# Patient Record
Sex: Male | Born: 1970
Health system: Southern US, Community
[De-identification: ages and names within clinical notes are randomized; demographics above are authoritative.]

## PROBLEM LIST (undated history)

## (undated) DIAGNOSIS — G43909 Migraine, unspecified, not intractable, without status migrainosus: Secondary | ICD-10-CM

## (undated) DIAGNOSIS — R55 Syncope and collapse: Secondary | ICD-10-CM

## (undated) DIAGNOSIS — F419 Anxiety disorder, unspecified: Secondary | ICD-10-CM

## (undated) DIAGNOSIS — E785 Hyperlipidemia, unspecified: Secondary | ICD-10-CM

## (undated) DIAGNOSIS — K219 Gastro-esophageal reflux disease without esophagitis: Secondary | ICD-10-CM

## (undated) HISTORY — DX: Syncope and collapse: R55

## (undated) HISTORY — DX: Gastro-esophageal reflux disease without esophagitis: K21.9

## (undated) HISTORY — DX: Anxiety disorder, unspecified: F41.9

## (undated) HISTORY — DX: Hyperlipidemia, unspecified: E78.5

## (undated) HISTORY — DX: Migraine, unspecified, not intractable, without status migrainosus: G43.909

---

## 2013-01-26 LAB — TSH: TSH: 0.59 u[IU]/mL (ref <=5.90)

## 2013-08-09 LAB — HEPATIC FUNCTION PANEL
ALK PHOS: 66 U/L (ref 25–125)
ALT: 32 U/L (ref 10–40)
AST: 21 U/L (ref 14–40)
BILIRUBIN, TOTAL: 0.6 mg/dL

## 2013-08-09 LAB — BASIC METABOLIC PANEL
BUN: 18 mg/dL (ref 4–21)
Creatinine: 0.9 mg/dL (ref 0.6–1.3)
GLUCOSE: 84 mg/dL
Potassium: 4.7 mmol/L (ref 3.4–5.3)
Sodium: 142 mmol/L (ref 137–147)

## 2013-08-09 LAB — LIPID PANEL
Cholesterol: 141 mg/dL (ref 0–200)
HDL: 63 mg/dL (ref 35–70)
LDL CALC: 63 mg/dL
Triglycerides: 74 mg/dL (ref 40–160)

## 2013-09-15 ENCOUNTER — Ambulatory Visit (INDEPENDENT_AMBULATORY_CARE_PROVIDER_SITE_OTHER): Payer: Commercial Managed Care - PPO | Admitting: Internal Medicine

## 2013-09-15 ENCOUNTER — Encounter: Payer: Self-pay | Admitting: Internal Medicine

## 2013-09-15 ENCOUNTER — Other Ambulatory Visit (INDEPENDENT_AMBULATORY_CARE_PROVIDER_SITE_OTHER): Payer: Commercial Managed Care - PPO

## 2013-09-15 VITALS — BP 114/70 | HR 81 | Temp 98.7°F | Resp 16 | Ht 71.0 in | Wt 158.0 lb

## 2013-09-15 DIAGNOSIS — A048 Other specified bacterial intestinal infections: Secondary | ICD-10-CM

## 2013-09-15 DIAGNOSIS — F411 Generalized anxiety disorder: Secondary | ICD-10-CM

## 2013-09-15 DIAGNOSIS — K2100 Gastro-esophageal reflux disease with esophagitis, without bleeding: Secondary | ICD-10-CM

## 2013-09-15 DIAGNOSIS — K219 Gastro-esophageal reflux disease without esophagitis: Secondary | ICD-10-CM | POA: Insufficient documentation

## 2013-09-15 DIAGNOSIS — E785 Hyperlipidemia, unspecified: Secondary | ICD-10-CM

## 2013-09-15 LAB — CBC WITH DIFFERENTIAL/PLATELET
Basophils Absolute: 0 10*3/uL (ref 0.0–0.1)
Basophils Relative: 0.3 % (ref 0.0–3.0)
Eosinophils Absolute: 0.2 10*3/uL (ref 0.0–0.7)
Eosinophils Relative: 4.5 % (ref 0.0–5.0)
HCT: 44.6 % (ref 39.0–52.0)
Hemoglobin: 15.1 g/dL (ref 13.0–17.0)
LYMPHS PCT: 29.4 % (ref 12.0–46.0)
Lymphs Abs: 1.6 10*3/uL (ref 0.7–4.0)
MCHC: 33.8 g/dL (ref 30.0–36.0)
MCV: 94.6 fl (ref 78.0–100.0)
Monocytes Absolute: 0.5 10*3/uL (ref 0.1–1.0)
Monocytes Relative: 9.1 % (ref 3.0–12.0)
NEUTROS PCT: 56.7 % (ref 43.0–77.0)
Neutro Abs: 3.1 10*3/uL (ref 1.4–7.7)
PLATELETS: 190 10*3/uL (ref 150.0–400.0)
RBC: 4.72 Mil/uL (ref 4.22–5.81)
RDW: 13.1 % (ref 11.5–15.5)
WBC: 5.5 10*3/uL (ref 4.0–10.5)

## 2013-09-15 LAB — H. PYLORI ANTIBODY, IGG: H Pylori IgG: POSITIVE — AB

## 2013-09-15 MED ORDER — ALPRAZOLAM 1 MG PO TABS: 1.0000 mg | ORAL_TABLET | Freq: Two times a day (BID) | ORAL | Status: DC | PRN

## 2013-09-15 MED ORDER — LANSOPRAZOLE 30 MG PO CPDR: 30.0000 mg | DELAYED_RELEASE_CAPSULE | Freq: Every day | ORAL | Status: DC

## 2013-09-15 NOTE — Patient Instructions (Signed)
Gastroesophageal Reflux Disease, Adult Gastroesophageal reflux disease (GERD) happens when acid from your stomach flows up into the esophagus. When acid comes in contact with the esophagus, the acid causes soreness (inflammation) in the esophagus. Over time, GERD may create small holes (ulcers) in the lining of the esophagus. CAUSES   Increased body weight. This puts pressure on the stomach, making acid rise from the stomach into the esophagus.  Smoking. This increases acid production in the stomach.  Drinking alcohol. This causes decreased pressure in the lower esophageal sphincter (valve or ring of muscle between the esophagus and stomach), allowing acid from the stomach into the esophagus.  Late evening meals and a full stomach. This increases pressure and acid production in the stomach.  A malformed lower esophageal sphincter. Sometimes, no cause is found. SYMPTOMS   Burning pain in the lower part of the mid-chest behind the breastbone and in the mid-stomach area. This may occur twice a week or more often.  Trouble swallowing.  Sore throat.  Dry cough.  Asthma-like symptoms including chest tightness, shortness of breath, or wheezing. DIAGNOSIS  Your caregiver may be able to diagnose GERD based on your symptoms. In some cases, X-rays and other tests may be done to check for complications or to check the condition of your stomach and esophagus. TREATMENT  Your caregiver may recommend over-the-counter or prescription medicines to help decrease acid production. Ask your caregiver before starting or adding any new medicines.  HOME CARE INSTRUCTIONS   Change the factors that you can control. Ask your caregiver for guidance concerning weight loss, quitting smoking, and alcohol consumption.  Avoid foods and drinks that make your symptoms worse, such as:  Caffeine or alcoholic drinks.  Chocolate.  Peppermint or mint flavorings.  Garlic and onions.  Spicy foods.  Citrus fruits,  such as oranges, lemons, or limes.  Tomato-based foods such as sauce, chili, salsa, and pizza.  Fried and fatty foods.  Avoid lying down for the 3 hours prior to your bedtime or prior to taking a nap.  Eat small, frequent meals instead of large meals.  Wear loose-fitting clothing. Do not wear anything tight around your waist that causes pressure on your stomach.  Raise the head of your bed 6 to 8 inches with wood blocks to help you sleep. Extra pillows will not help.  Only take over-the-counter or prescription medicines for pain, discomfort, or fever as directed by your caregiver.  Do not take aspirin, ibuprofen, or other nonsteroidal anti-inflammatory drugs (NSAIDs). SEEK IMMEDIATE MEDICAL CARE IF:   You have pain in your arms, neck, jaw, teeth, or back.  Your pain increases or changes in intensity or duration.  You develop nausea, vomiting, or sweating (diaphoresis).  You develop shortness of breath, or you faint.  Your vomit is green, yellow, black, or looks like coffee grounds or blood.  Your stool is red, bloody, or black. These symptoms could be signs of other problems, such as heart disease, gastric bleeding, or esophageal bleeding. MAKE SURE YOU:   Understand these instructions.  Will watch your condition.  Will get help right away if you are not doing well or get worse. Document Released: 12/03/2004 Document Revised: 05/18/2011 Document Reviewed: 09/12/2010 ExitCare Patient Information 2015 [employer]. This information is not intended to replace advice given to you by your health care provider. Make sure you discuss any questions you have with your health care provider.  

## 2013-09-15 NOTE — Progress Notes (Signed)
Pre visit review using our clinic review tool, if applicable. No additional management support is needed unless otherwise documented below in the visit note. 

## 2013-09-16 ENCOUNTER — Encounter: Payer: Self-pay | Admitting: Internal Medicine

## 2013-09-16 NOTE — Assessment & Plan Note (Signed)
He is doing well on lipitor 

## 2013-09-16 NOTE — Assessment & Plan Note (Signed)
Ab test is + and he has some concerning UGI symptoms I have asked him to see GI to consider having an EGD done

## 2013-09-16 NOTE — Assessment & Plan Note (Signed)
He does not wants to take an SSRI Will cont xanax as needed

## 2013-09-16 NOTE — Addendum Note (Signed)
Addended by: Janith Lima on: 09/16/2013 12:27 PM   Modules accepted: Orders

## 2013-09-16 NOTE — Progress Notes (Signed)
Subjective:    Patient ID: Mark Flores, male    DOB: 07-10-1970, 43 y.o.   MRN: [id number]  Gastrophageal Reflux He complains of belching, dysphagia and heartburn. He reports no abdominal pain, no chest pain, no choking, no coughing, no early satiety, no globus sensation, no hoarse voice, no nausea, no sore throat, no stridor, no tooth decay, no water brash or no wheezing. This is a recurrent problem. The current episode started more than 1 year ago. The problem occurs frequently. The problem has been gradually worsening. The symptoms are aggravated by ETOH. Pertinent negatives include no anemia, fatigue, melena, muscle weakness, orthopnea or weight loss. Risk factors include ETOH use and NSAIDs. He has tried a histamine-2 antagonist for the symptoms. The treatment provided mild relief. Past procedures include an EGD (several years ago, +HBP test in [place]).      Review of Systems  Constitutional: Negative.  Negative for fever, chills, weight loss, diaphoresis, appetite change, fatigue and unexpected weight change.  HENT: Negative.  Negative for hoarse voice and sore throat.   Eyes: Negative.   Respiratory: Negative.  Negative for cough, choking, chest tightness, shortness of breath, wheezing and stridor.   Cardiovascular: Negative.  Negative for chest pain, palpitations and leg swelling.  Gastrointestinal: Positive for heartburn and dysphagia. Negative for nausea, vomiting, abdominal pain, diarrhea, constipation, blood in stool and melena.  Endocrine: Negative.   Genitourinary: Negative.   Musculoskeletal: Negative.  Negative for arthralgias, back pain, gait problem, joint swelling, muscle weakness, myalgias, neck pain and neck stiffness.  Skin: Negative.  Negative for rash.  Allergic/Immunologic: Negative.   Neurological: Negative.   Hematological: Negative.  Negative for adenopathy. Does not bruise/bleed easily.  Psychiatric/Behavioral: Positive for sleep disturbance. Negative for  suicidal ideas, hallucinations, behavioral problems, confusion, self-injury, dysphoric mood, decreased concentration and agitation. The patient is nervous/anxious. The patient is not hyperactive.        Objective:   Physical Exam  Vitals reviewed. Constitutional: He is oriented to person, place, and time. He appears well-developed and well-nourished. No distress.  HENT:  Head: Normocephalic and atraumatic.  Mouth/Throat: Oropharynx is clear and moist. No oropharyngeal exudate.  Eyes: Conjunctivae are normal. Right eye exhibits no discharge. Left eye exhibits no discharge. No scleral icterus.  Neck: Normal range of motion. Neck supple. No JVD present. No tracheal deviation present. No thyromegaly present.  Cardiovascular: Normal rate, regular rhythm, normal heart sounds and intact distal pulses.  Exam reveals no gallop and no friction rub.   No murmur heard. Pulmonary/Chest: Effort normal and breath sounds normal. No stridor. No respiratory distress. He has no wheezes. He has no rales. He exhibits no tenderness.  Abdominal: Soft. Bowel sounds are normal. He exhibits no distension and no mass. There is no tenderness. There is no rebound and no guarding.  Musculoskeletal: Normal range of motion. He exhibits no edema and no tenderness.  Lymphadenopathy:    He has no cervical adenopathy.  Neurological: He is oriented to person, place, and time.  Skin: Skin is warm and dry. No rash noted. He is not diaphoretic. No erythema. No pallor.  Psychiatric: His speech is normal and behavior is normal. Judgment and thought content normal. His mood appears anxious. His affect is not angry, not blunt, not labile and not inappropriate. Cognition and memory are normal. He does not exhibit a depressed mood. He expresses no homicidal and no suicidal ideation. He expresses no suicidal plans and no homicidal plans.  Assessment & Plan:

## 2013-09-16 NOTE — Assessment & Plan Note (Signed)
I have asked him to start a PPI for this He is not anemic but he does have some concerning UGI s/s so I have asked him to see GI for further evaluation

## 2013-09-18 ENCOUNTER — Other Ambulatory Visit: Payer: Self-pay

## 2013-09-18 MED ORDER — ATORVASTATIN CALCIUM 10 MG PO TABS: 10.0000 mg | ORAL_TABLET | Freq: Every day | ORAL | Status: DC

## 2013-10-03 ENCOUNTER — Encounter: Payer: Self-pay | Admitting: Internal Medicine

## 2013-10-11 ENCOUNTER — Other Ambulatory Visit: Payer: Self-pay

## 2013-10-11 MED ORDER — ATORVASTATIN CALCIUM 10 MG PO TABS: 10.0000 mg | ORAL_TABLET | Freq: Every day | ORAL | Status: DC

## 2013-10-13 ENCOUNTER — Other Ambulatory Visit: Payer: Self-pay | Admitting: Internal Medicine

## 2013-10-13 DIAGNOSIS — E785 Hyperlipidemia, unspecified: Secondary | ICD-10-CM

## 2013-10-13 MED ORDER — ATORVASTATIN CALCIUM 20 MG PO TABS: 20.0000 mg | ORAL_TABLET | Freq: Every day | ORAL | Status: DC

## 2013-12-11 ENCOUNTER — Encounter: Payer: Self-pay | Admitting: Internal Medicine

## 2013-12-11 ENCOUNTER — Ambulatory Visit (INDEPENDENT_AMBULATORY_CARE_PROVIDER_SITE_OTHER): Payer: Commercial Managed Care - PPO | Admitting: Internal Medicine

## 2013-12-11 VITALS — BP 128/86 | HR 65 | Temp 98.5°F | Resp 16 | Ht 71.0 in | Wt 156.0 lb

## 2013-12-11 DIAGNOSIS — B001 Herpesviral vesicular dermatitis: Secondary | ICD-10-CM

## 2013-12-11 MED ORDER — VALACYCLOVIR HCL 1 G PO TABS: 1000.0000 mg | ORAL_TABLET | Freq: Two times a day (BID) | ORAL | Status: AC

## 2013-12-11 MED ORDER — TRIAMCINOLONE ACETONIDE 0.5 % EX CREA: 1.0000 "application " | TOPICAL_CREAM | Freq: Two times a day (BID) | CUTANEOUS | Status: DC

## 2013-12-11 NOTE — Progress Notes (Signed)
Pre visit review using our clinic review tool, if applicable. No additional management support is needed unless otherwise documented below in the visit note. 

## 2013-12-11 NOTE — Patient Instructions (Signed)
Herpes Labialis  You have a fever blister or cold sore (herpes labialis). These painful, grouped sores are caused by one of the herpes viruses (HSV1 most commonly). They are usually found around the lips and mouth, but the same infection can also affect other areas on the face such as the nose and eyes. Herpes infections take about 10 days to heal. They often occur again and again in the same spot. Other symptoms may include numbness and tingling in the involved skin, achiness, fever, and swollen glands in the neck. Colds, emotional stress, injuries, or excess sunlight exposure all seem to make herpes reappear. Herpes lip infections are contagious. Direct contact with these sores can spread the infection. It can also be spread to other parts of your own body.  TREATMENT   Herpes labialis is usually self-limited and resolves within 1 week. To reduce pain and swelling, apply ice packs frequently to the sores or suck on popsicles or frozen juice bars. Antiviral medicine may be used by mouth to shorten the duration of the breakout. Avoid spreading the infection by washing your hands often. Be careful not to touch your eyes or genital areas after handling the infected blisters. Do not kiss or have other intimate contact with others. After the blisters are completely healed you may resume contact. Use sunscreen to lessen recurrences.   If this is your first infection with herpes, or if you have a severe or repeated infections, your caregiver may prescribe one of the anti-viral drugs to speed up the healing. If you have sun-related flare-ups despite the use of sunscreen, starting oral anti-viral medicine before a prolonged exposure (going skiing or to the beach) can prevent most episodes.   SEEK IMMEDIATE MEDICAL CARE IF:  · You develop a headache, sleepiness, high fever, vomiting, or severe weakness.  · You have eye irritation, pain, blurred vision or redness.  · You develop a prolonged infection not getting better in 10  days.  Document Released: 02/23/2005 Document Revised: 05/18/2011 Document Reviewed: 12/28/2008  ExitCare® Patient Information ©2015 [employer]. This information is not intended to replace advice given to you by your health care provider. Make sure you discuss any questions you have with your health care provider.

## 2013-12-14 ENCOUNTER — Encounter: Payer: Self-pay | Admitting: Internal Medicine

## 2013-12-14 NOTE — Assessment & Plan Note (Signed)
Will treat the infection with valtrex and will reduce the symptoms with TAC cream

## 2013-12-14 NOTE — Progress Notes (Signed)
   Subjective:    Patient ID: Mark Flores, male    DOB: 1970-12-07, 43 y.o.   MRN: [id number]  HPI Comments: He complains of right upper lip irritation with redness and swelling for several weeks, he has applied multiple OTC topicals to the area without any relief from the symptoms. He has never had this before.     Review of Systems  Constitutional: Negative.  Negative for fever, chills, diaphoresis, appetite change and fatigue.  HENT: Negative.  Negative for mouth sores, sore throat and trouble swallowing.   Eyes: Negative.   Respiratory: Negative.  Negative for cough, choking, shortness of breath and stridor.   Cardiovascular: Negative.   Gastrointestinal: Negative.  Negative for abdominal pain.  Endocrine: Negative.   Genitourinary: Negative.   Musculoskeletal: Negative.   Skin: Positive for rash. Negative for color change, pallor and wound.  Allergic/Immunologic: Negative.   Neurological: Negative.   Hematological: Negative.   Psychiatric/Behavioral: Negative.        Objective:   Physical Exam  Vitals reviewed. Constitutional: He is oriented to person, place, and time. He appears well-developed and well-nourished. No distress.  HENT:  Head:    Mouth/Throat: Oropharynx is clear and moist and mucous membranes are normal. Mucous membranes are not pale, not dry and not cyanotic. No oral lesions. No trismus in the jaw. No uvula swelling. No oropharyngeal exudate, posterior oropharyngeal edema, posterior oropharyngeal erythema or tonsillar abscesses.  Eyes: Conjunctivae are normal. Right eye exhibits no discharge. Left eye exhibits no discharge. No scleral icterus.  Neck: Normal range of motion. Neck supple. No JVD present. No tracheal deviation present. No thyromegaly present.  Cardiovascular: Normal rate, regular rhythm, normal heart sounds and intact distal pulses.  Exam reveals no gallop and no friction rub.   No murmur heard. Pulmonary/Chest: Effort normal and breath  sounds normal. No stridor. No respiratory distress. He has no wheezes. He has no rales. He exhibits no tenderness.  Abdominal: Soft. Bowel sounds are normal. He exhibits no distension and no mass. There is no tenderness. There is no rebound and no guarding.  Musculoskeletal: Normal range of motion. He exhibits no edema and no tenderness.  Lymphadenopathy:    He has no cervical adenopathy.  Neurological: He is oriented to person, place, and time.  Skin: Skin is warm and dry. No rash noted. He is not diaphoretic. No erythema. No pallor.          Assessment & Plan:

## 2014-06-15 ENCOUNTER — Other Ambulatory Visit: Payer: Self-pay

## 2014-06-15 DIAGNOSIS — F411 Generalized anxiety disorder: Secondary | ICD-10-CM

## 2014-06-15 MED ORDER — ALPRAZOLAM 1 MG PO TABS: 1.0000 mg | ORAL_TABLET | Freq: Two times a day (BID) | ORAL | Status: DC | PRN

## 2015-07-30 ENCOUNTER — Other Ambulatory Visit: Payer: Self-pay | Admitting: Oncology

## 2015-07-30 ENCOUNTER — Telehealth: Payer: Self-pay | Admitting: Oncology

## 2015-07-30 ENCOUNTER — Encounter: Payer: Self-pay | Admitting: Oncology

## 2015-07-30 NOTE — Telephone Encounter (Signed)
Telephone call to patient. Appointment scheduled on 6/5 with Dr. Jana Hakim at 4:30pm. Patient agreed to appointment date and time. Demographics was verified. Message sent to the navigators.

## 2015-08-12 ENCOUNTER — Ambulatory Visit (HOSPITAL_BASED_OUTPATIENT_CLINIC_OR_DEPARTMENT_OTHER): Payer: Commercial Managed Care - PPO | Admitting: Oncology

## 2015-08-12 ENCOUNTER — Other Ambulatory Visit (HOSPITAL_BASED_OUTPATIENT_CLINIC_OR_DEPARTMENT_OTHER): Payer: Commercial Managed Care - PPO

## 2015-08-12 VITALS — BP 119/72 | HR 63 | Temp 98.4°F | Resp 20 | Ht 71.0 in | Wt 162.8 lb

## 2015-08-12 DIAGNOSIS — Z79811 Long term (current) use of aromatase inhibitors: Secondary | ICD-10-CM

## 2015-08-12 DIAGNOSIS — N62 Hypertrophy of breast: Secondary | ICD-10-CM | POA: Diagnosis not present

## 2015-08-12 LAB — COMPREHENSIVE METABOLIC PANEL
ALBUMIN: 4.4 g/dL (ref 3.5–5.0)
ALK PHOS: 64 U/L (ref 40–150)
ALT: 21 U/L (ref 0–55)
AST: 15 U/L (ref 5–34)
Anion Gap: 8 mEq/L (ref 3–11)
BUN: 20.4 mg/dL (ref 7.0–26.0)
CALCIUM: 9.9 mg/dL (ref 8.4–10.4)
CHLORIDE: 104 meq/L (ref 98–109)
CO2: 31 mEq/L — ABNORMAL HIGH (ref 22–29)
Creatinine: 1.2 mg/dL (ref 0.7–1.3)
EGFR: 74 mL/min/{1.73_m2} — ABNORMAL LOW (ref >90)
Glucose: 94 mg/dl (ref 70–140)
Potassium: 4 mEq/L (ref 3.5–5.1)
Sodium: 142 mEq/L (ref 136–145)
Total Bilirubin: 0.57 mg/dL (ref 0.20–1.20)
Total Protein: 8.1 g/dL (ref 6.4–8.3)

## 2015-08-12 LAB — CBC WITH DIFFERENTIAL/PLATELET
BASO%: 0.6 % (ref 0.0–2.0)
Basophils Absolute: 0 10*3/uL (ref 0.0–0.1)
EOS%: 5.2 % (ref 0.0–7.0)
Eosinophils Absolute: 0.2 10*3/uL (ref 0.0–0.5)
HEMATOCRIT: 46.4 % (ref 38.4–49.9)
HEMOGLOBIN: 15.6 g/dL (ref 13.0–17.1)
LYMPH#: 1.4 10*3/uL (ref 0.9–3.3)
LYMPH%: 31.1 % (ref 14.0–49.0)
MCH: 32.1 pg (ref 27.2–33.4)
MCHC: 33.7 g/dL (ref 32.0–36.0)
MCV: 95.3 fL (ref 79.3–98.0)
MONO#: 0.4 10*3/uL (ref 0.1–0.9)
MONO%: 8.4 % (ref 0.0–14.0)
NEUT#: 2.5 10*3/uL (ref 1.5–6.5)
NEUT%: 54.7 % (ref 39.0–75.0)
Platelets: 186 10*3/uL (ref 140–400)
RBC: 4.88 10*6/uL (ref 4.20–5.82)
RDW: 13 % (ref 11.0–14.6)
WBC: 4.6 10*3/uL (ref 4.0–10.3)

## 2015-08-12 MED ORDER — ANASTROZOLE 1 MG PO TABS: 1.0000 mg | ORAL_TABLET | Freq: Every day | ORAL | Status: DC

## 2015-08-12 NOTE — Progress Notes (Signed)
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 In 2012 he developed pain and sensitivity in both breasts, and a lump in his right breast, which was evaluated by right ultrasonography 09/26/2010. This showed a benign cyst measuring 0.6 cm but also low level echoes directly behind the nipple consistent with gynecomastia. There were no suspicious solid masses. The axilla was unremarkable.  His finasteride was stopped and he was started on anastrozole for "a few months", with excellent clinical results. However he subsequently resumed that medication and again developed bilateral breast discomfort, right greater than left. We have bilateral digital mammography with right sonography from a different mammographic Center in [place] dated March 20 713. This found the same 0.6 cm right breast cyst as previously noted. There was bilateral fibroglandular tissue underlying the left nipples, right much greater than left. There were no suspicious microcalcifications and no axillary adenopathy.  The patient went off Propecia definitively approximately 18 months ago, he says, but more recently he has had again symptoms involving both breasts. This time the left breast is more of a problem. Both breasts are sensitive, there slightly painful, and he is concerned that they may be developing a mass. He brought this to Dr. Darien Ramus attention and he was referred here for further evaluation and treatment  Approximately a year later however  INTERVAL  HISTORY: Mark Flores was evaluated in the breast clinic 08/12/2015  REVIEW OF SYSTEMS: Aside from the gynecomastia, he reports no other problems. He exercises regularly. A detailed review of systems today was otherwise noncontributory  PAST MEDICAL HISTORY: Past Medical History  Diagnosis Date  . GERD (gastroesophageal reflux disease)   . Fainting episodes   . Hyperlipidemia   . Migraine   . Anxiety     PAST SURGICAL HISTORY: No past surgical history on file.  FAMILY HISTORY Family History  Problem Relation Age of Onset  . Cancer Maternal Grandmother   . Heart disease Maternal Grandmother   . Hypertension Maternal Grandmother   The patient's parents are both alive and age 29 as of June 2017. They live in the [facility] area. The patient has one brother, no sisters. The patient's maternal grandmother was diagnosed with breast cancer in her 57s. A paternal grandfather was diagnosed with some kind of cancer in his late 23s. There is no history of ovarian cancer in the family.  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 His husband [name] works in [employer] here. They have 2 dogs at home, a chocolate lab and a pit bull mix.    ADVANCED DIRECTIVES: Not in place. At the 08/12/2015 visit the patient was given the appropriate forms to complete and notarize at his discretion   HEALTH MAINTENANCE: Social History  Substance Use Topics  . Smoking status: Never Smoker   . Smokeless tobacco: Never Used  . Alcohol Use: 6.0 oz/week    10 Glasses of wine per week     Colonoscopy:  PSA:  Lipid panel:  Allergies  Allergen Reactions  . Sulfa Antibiotics Rash  Current Outpatient Prescriptions  Medication Sig Dispense Refill  . ALPRAZolam (XANAX) 1 MG tablet Take 1 tablet (1 mg total) by mouth 2 (two) times daily as needed for anxiety. 60 tablet 0  . anastrozole (ARIMIDEX) 1 MG tablet Take 1 tablet (1 mg total) by mouth daily. 90 tablet 1    No current facility-administered medications for this visit.    OBJECTIVE:Middle aged white man in no acute distress Filed Vitals:   08/12/15 1624  BP: 119/72  Pulse: 63  Temp: 98.4 F (36.9 C)  Resp: 20     Body mass index is 22.72 kg/(m^2).    ECOG FS:0 - Asymptomatic  Ocular: Sclerae unicteric, pupils equal, round and reactive to light Ear-nose-throat: Oropharynx clear and moist Lymphatic: No cervical or supraclavicular adenopathy Lungs no rales or rhonchi, good excursion bilaterally Heart regular rate and rhythm, no murmur appreciated Abd soft, nontender, positive bowel sounds MSK no focal spinal tenderness, no joint edema Neuro: non-focal, well-oriented, appropriate affect Breasts: No masses are palpated under either breast; there is no significant tenderness to palpation. There is no erythema or swelling. There is mild to moderate glandular tissue chiefly beneath the areola areas   LAB RESULTS:  CMP     Component Value Date/Time   NA 142 08/12/2015 1606   NA 142 08/09/2013   K 4.0 08/12/2015 1606   K 4.7 08/09/2013   CO2 31* 08/12/2015 1606   GLUCOSE 94 08/12/2015 1606   BUN 20.4 08/12/2015 1606   BUN 18 08/09/2013   CREATININE 1.2 08/12/2015 1606   CREATININE 0.9 08/09/2013   CALCIUM 9.9 08/12/2015 1606   PROT 8.1 08/12/2015 1606   ALBUMIN 4.4 08/12/2015 1606   AST 15 08/12/2015 1606   AST 21 08/09/2013   ALT 21 08/12/2015 1606   ALT 32 08/09/2013   ALKPHOS 64 08/12/2015 1606   ALKPHOS 66 08/09/2013   BILITOT 0.57 08/12/2015 1606    INo results found for: SPEP, UPEP  Lab Results  Component Value Date   WBC 4.6 08/12/2015   NEUTROABS 2.5 08/12/2015   HGB 15.6 08/12/2015   HCT 46.4 08/12/2015   MCV 95.3 08/12/2015   PLT 186 08/12/2015      Chemistry      Component Value Date/Time   NA 142 08/12/2015 1606   NA 142 08/09/2013   K 4.0 08/12/2015 1606   K 4.7 08/09/2013   CO2 31* 08/12/2015 1606   BUN 20.4 08/12/2015 1606   BUN 18  08/09/2013   CREATININE 1.2 08/12/2015 1606   CREATININE 0.9 08/09/2013   GLU 84 08/09/2013      Component Value Date/Time   CALCIUM 9.9 08/12/2015 1606   ALKPHOS 64 08/12/2015 1606   ALKPHOS 66 08/09/2013   AST 15 08/12/2015 1606   AST 21 08/09/2013   ALT 21 08/12/2015 1606   ALT 32 08/09/2013   BILITOT 0.57 08/12/2015 1606       No results found for: LABCA2  No components found for: LABCA125  No results for input(s): INR in the last 168 hours.  Urinalysis No results found for: COLORURINE, APPEARANCEUR, LABSPEC, PHURINE, GLUCOSEU, HGBUR, BILIRUBINUR, KETONESUR, PROTEINUR, UROBILINOGEN, NITRITE, LEUKOCYTESUR    ELIGIBLE FOR AVAILABLE RESEARCH PROTOCOL: no  STUDIES: No results found.  ASSESSMENT: 45 y.o. [place] man with a history of gynecomastia associated with finasteride/Propecia, treated in the past with discontinuation of finasteride and a brief course (3 months) of anastrozole; now recurrent despite being off the finasteride for more than a year  PLAN: I don't have a simple explanation why she had would have recurrent gynecomastia at this point, since he has not been taking finasteride he tells me for 12-18 months. Alcohol of course can be a contributing cause but he drinks only moderately. I am sending additional lab work including [place], beta HCG, estradiol and testosterone levels to get a better idea of what may be going on and screen for a testicular tumor.  We discussed the difference between gynecomastia and simple fatty breasts, the mechanism of action of finasteride, which may have caused the glands in his breast to develop (so that he would have more glandular tissue present even after stopping the finasteride), and the fact that testosterone and estrogen are interchangeable and in some kind of ratio determined by the body.  In his case we have to postulate that the ratio is tilted in the direction of estrogen.  By exam I do not feel a mass so I do not think  mammography is necessary at this point. He is symptomatic however, and warrants treatment.  Probably the most common treatment for male gynecomastia is tamoxifen. We certainly could go that way the only real concern being the increased risk of blood clots.  On the other hand he was treated with anastrozole with excellent results in the past and there is no life-threatening side effect associated with this medication.  Accordingly I am starting him on anastrozole he will take this for 3 months and return to see me in September. I will repeat his testosterone and estradiol levels at that time, and I expect we will likely stop the anastrozole then. I don't anticipate his needing long-term follow-up here, and likely from that point we will leave it open whether he needs further visits at this clinic.  I did give him copies of healthcare part of attorney documents, in case for some reason [place] might decide they do not recognize their marriage and there might be an issue regarding health care decision making.  Mark Flores knows to call for any problems that may develop before his next visit here.  Chauncey Cruel, MD   08/12/2015 5:43 PM Medical Oncology and Hematology [facility] [address] [place], [place] [address] Tel. [phone]    Fax. [phone]

## 2015-08-13 ENCOUNTER — Telehealth: Payer: Self-pay | Admitting: Oncology

## 2015-08-13 LAB — TESTOSTERONE, FREE, TOTAL, SHBG
Sex Horm Binding Glob, Serum: 31 nmol/L (ref 16.5–55.9)
TESTOSTERONE FREE: 8.5 pg/mL (ref 6.8–21.5)
TESTOSTERONE: 370 ng/dL (ref 348–1197)

## 2015-08-13 LAB — LUTEINIZING HORMONE: LH: 5.2 m[IU]/mL (ref 1.7–8.6)

## 2015-08-13 LAB — PSA: Prostate Specific Ag, Serum: 1.1 ng/mL (ref 0.0–4.0)

## 2015-08-13 LAB — ESTRADIOL: Estradiol: 9.5 pg/mL (ref 7.6–42.6)

## 2015-08-13 LAB — BETA HCG QUANT (REF LAB)

## 2015-08-13 NOTE — Telephone Encounter (Signed)
s.w. pt and advised on Aug and Sept appt....pt ok and aware °

## 2015-09-11 ENCOUNTER — Other Ambulatory Visit: Payer: Self-pay | Admitting: Internal Medicine

## 2015-11-05 ENCOUNTER — Other Ambulatory Visit (HOSPITAL_BASED_OUTPATIENT_CLINIC_OR_DEPARTMENT_OTHER): Payer: Commercial Managed Care - PPO

## 2015-11-05 DIAGNOSIS — N62 Hypertrophy of breast: Secondary | ICD-10-CM

## 2015-11-06 LAB — TESTOSTERONE, FREE, TOTAL, SHBG
Sex Horm Binding Glob, Serum: 38 nmol/L (ref 16.5–55.9)
TESTOSTERONE FREE: 16.9 pg/mL (ref 6.8–21.5)
Testosterone, Serum: 725 ng/dL (ref 264–916)

## 2015-11-06 LAB — ESTRADIOL

## 2015-11-06 LAB — LUTEINIZING HORMONE: LH: 7.1 m[IU]/mL (ref 1.7–8.6)

## 2015-11-11 NOTE — Progress Notes (Signed)
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 In 2012 he developed pain and sensitivity in both breasts, and a lump in his right breast, which was evaluated by right ultrasonography 09/26/2010. This showed a benign cyst measuring 0.6 cm but also low level echoes directly behind the nipple consistent with gynecomastia. There were no suspicious solid masses. The axilla was unremarkable.  His finasteride was stopped and he was started on anastrozole for "a few months", with excellent clinical results. However he subsequently resumed that medication and again developed bilateral breast discomfort, right greater than left. We have bilateral digital mammography with right sonography from a different mammographic Center in [place] dated March 20 713. This found the same 0.6 cm right breast cyst as previously noted. There was bilateral fibroglandular tissue underlying the left nipples, right much greater than left. There were no suspicious microcalcifications and no axillary adenopathy.  The patient went off Propecia definitively approximately 18 months ago, he says, but more recently he has had again symptoms involving both breasts. This time the left breast is more of a problem. Both breasts are sensitive, there slightly painful, and he is concerned that they may be developing a mass. He brought this to Dr. Darien Flores attention and he was referred here for further evaluation and treatment  Approximately a  year later however  INTERVAL HISTORY: Mark returns today for follow-up of his history of gynecomastia. After his first visit here we obtain baseline labs which showed a low testosterone level and an inappropriately normal estrogen level. This was after he had been off finasteride for several months. He started anastrozole at that time, and he has tolerated that well. He has no side effects from it that he is aware of. After being on anastrozole approximately 3 months we repeated the estrogen and testosterone levels and the testosterone level is now in the normal range and the estrogen level is appropriately low.  REVIEW OF SYSTEMS: She feels his breasts are little bit less prominent but there is still some tingling and itching in both breasts, right greater than left. The right breast is also slightly larger than the left. Aside from these concerns, a detailed review of systems today was negative. He exercises regularly at the gym which is close to his home.  PAST MEDICAL HISTORY: Past Medical History:  Diagnosis Date  . Anxiety   . Fainting episodes   . GERD (gastroesophageal reflux disease)   . Hyperlipidemia   . Migraine     PAST SURGICAL HISTORY: No past surgical history on file.  FAMILY HISTORY Family History  Problem Relation Age of Onset  . Cancer Maternal Grandmother   . Heart disease Maternal Grandmother   . Hypertension Maternal Grandmother   The patient's parents are both alive and age 81 as of June 2017. They [place] in the [place] area. The patient has one brother, no sisters. The patient's maternal grandmother was diagnosed with breast cancer in her 41s. A paternal grandfather was diagnosed with some kind of cancer in his late 68s. There is  no history of ovarian cancer in the family.  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 His husband Mark [place] works in [employer] here. They have 2 dogs at home, a chocolate lab and a pit  bull mix.    ADVANCED DIRECTIVES: Not in place. At the 08/12/2015 visit the patient was given the appropriate forms to complete and notarize at his discretion   HEALTH MAINTENANCE: Social History  Substance Use Topics  . Smoking status: Never Smoker  . Smokeless tobacco: Never Used  . Alcohol use 6.0 oz/week    10 Glasses of wine per week     Colonoscopy:  PSA:  Lipid panel:  Allergies  Allergen Reactions  . Sulfa Antibiotics Rash    Current Outpatient Prescriptions  Medication Sig Dispense Refill  . anastrozole (ARIMIDEX) 1 MG tablet Take 1 tablet (1 mg total) by mouth daily. 90 tablet 1   No current facility-administered medications for this visit.     OBJECTIVE:Middle aged white man in no acute distress Vitals:   11/12/15 1608  BP: 109/70  Pulse: 68  Resp: 18  Temp: 99 F (37.2 C)     Body mass index is 22.64 kg/m.    ECOG FS:0 - Asymptomatic  Sclerae unicteric, pupils round and equal Oropharynx clear and moist-- no thrush or other lesions No cervical or supraclavicular adenopathy Lungs no rales or rhonchi Heart regular rate and rhythm Abd soft, nontender, positive bowel sounds MSK no focal spinal tenderness, no upper extremity lymphedema Neuro: nonfocal, well oriented, appropriate affect Breasts: The right breast is approximately 5% larger than the left. There is a little bit of fat "overhanging" just above the areola. There is no erythema, no tenderness, and no palpable masses. The right axilla is benign. Left breast appears normal to me. The left axilla is benign.    LAB RESULTS:  CMP     Component Value Date/Time   NA 142 08/12/2015 1606   K 4.0 08/12/2015 1606   CO2 31 (H) 08/12/2015 1606   GLUCOSE 94 08/12/2015 1606   BUN 20.4 08/12/2015 1606   CREATININE 1.2 08/12/2015 1606   CALCIUM 9.9 08/12/2015 1606   PROT 8.1 08/12/2015 1606   ALBUMIN 4.4 08/12/2015 1606   AST 15 08/12/2015 1606   ALT 21 08/12/2015 1606   ALKPHOS 64 08/12/2015 1606     BILITOT 0.57 08/12/2015 1606    INo results found for: SPEP, UPEP  Lab Results  Component Value Date   WBC 4.6 08/12/2015   NEUTROABS 2.5 08/12/2015   HGB 15.6 08/12/2015   HCT 46.4 08/12/2015   MCV 95.3 08/12/2015   PLT 186 08/12/2015      Chemistry      Component Value Date/Time   NA 142 08/12/2015 1606   K 4.0 08/12/2015 1606   CO2 31 (H) 08/12/2015 1606   BUN 20.4 08/12/2015 1606   CREATININE 1.2 08/12/2015 1606   GLU 84 08/09/2013      Component Value Date/Time   CALCIUM 9.9 08/12/2015 1606   ALKPHOS 64 08/12/2015 1606   AST 15 08/12/2015 1606   ALT 21 08/12/2015 1606   BILITOT 0.57 08/12/2015 1606       No results found for: LABCA2  No components found for: LABCA125  No results for input(s): INR in the last 168 hours.  Urinalysis No results found for: COLORURINE, APPEARANCEUR, LABSPEC, PHURINE, GLUCOSEU, HGBUR, BILIRUBINUR, KETONESUR, PROTEINUR, UROBILINOGEN, NITRITE, LEUKOCYTESUR   Estradiol 7.6 -  42.6 pg/mL <5.0  9.5CM    Testosterone, Serum 264 - 916 ng/dL 725 370R     ELIGIBLE FOR AVAILABLE RESEARCH PROTOCOL: no  STUDIES: No results found.  ASSESSMENT: 45 y.o. [place] man with a history of gynecomastia associated with finasteride/Propecia, treated in the past with discontinuation of finasteride and a brief course (3 months) of anastrozole; now recurrent despite being off the finasteride for more than a year  (1) started anastrozole 08/12/2015, with normalization of hormones and improvement in the gynecomastia.  PLAN: Mark is tolerating the anastrozole well. His gynecomastia has improved but not completely resolved and particularly it is noticeable in the right breast. Accordingly I am not comfortable continuing the anastrozole another 3 months and then reassessing.  We will obtain lab work only about 3 months from now. We would not want his testosterone level to continue to rise as can be the case on anastrozole. We will also check a  baseline PSA at the next set of labs. In any case we will probably discontinue anastrozole then and then he would see me again in 3 months after that with another set of labs assuming all remains normal and that the gynecomastia has resolved, he would require no further follow-up here.  Mark has a good understanding of this plan. He knows to call for any problems that may develop before the next visit here   Mark Cruel, MD   11/12/2015 6:17 PM Medical Oncology and Hematology [facility] [address], [place] [address] Tel. [phone]    Fax. [phone]

## 2015-11-12 ENCOUNTER — Ambulatory Visit (HOSPITAL_BASED_OUTPATIENT_CLINIC_OR_DEPARTMENT_OTHER): Payer: Commercial Managed Care - PPO | Admitting: Oncology

## 2015-11-12 VITALS — BP 109/70 | HR 68 | Temp 99.0°F | Resp 18 | Ht 71.0 in | Wt 162.3 lb

## 2015-11-12 DIAGNOSIS — N62 Hypertrophy of breast: Secondary | ICD-10-CM | POA: Diagnosis not present

## 2015-11-12 DIAGNOSIS — Z79811 Long term (current) use of aromatase inhibitors: Secondary | ICD-10-CM | POA: Diagnosis not present

## 2016-02-11 ENCOUNTER — Other Ambulatory Visit (HOSPITAL_BASED_OUTPATIENT_CLINIC_OR_DEPARTMENT_OTHER): Payer: Commercial Managed Care - PPO

## 2016-02-11 DIAGNOSIS — N62 Hypertrophy of breast: Secondary | ICD-10-CM

## 2016-02-12 ENCOUNTER — Encounter: Payer: Self-pay | Admitting: Oncology

## 2016-02-12 LAB — TESTOSTERONE: TESTOSTERONE: 597 ng/dL (ref 264–916)

## 2016-02-12 LAB — PSA: PROSTATE SPECIFIC AG, SERUM: 1.1 ng/mL (ref 0.0–4.0)

## 2016-02-13 LAB — ESTRADIOL, ULTRA SENS: Estradiol, Sensitive: 10 pg/mL (ref 8.0–35.0)

## 2016-05-01 ENCOUNTER — Telehealth: Payer: Self-pay | Admitting: Oncology

## 2016-05-01 NOTE — Telephone Encounter (Signed)
Returned call to pt to r/s lab appt to 3/5 at 230 pm. Pt to return call to office if there is a conflict

## 2016-05-04 ENCOUNTER — Other Ambulatory Visit: Payer: Commercial Managed Care - PPO

## 2016-05-11 ENCOUNTER — Other Ambulatory Visit (HOSPITAL_BASED_OUTPATIENT_CLINIC_OR_DEPARTMENT_OTHER): Payer: Commercial Managed Care - PPO

## 2016-05-11 ENCOUNTER — Ambulatory Visit (HOSPITAL_BASED_OUTPATIENT_CLINIC_OR_DEPARTMENT_OTHER): Payer: Commercial Managed Care - PPO | Admitting: Oncology

## 2016-05-11 VITALS — BP 116/65 | HR 61 | Temp 98.2°F | Resp 18 | Ht 71.0 in | Wt 159.4 lb

## 2016-05-11 DIAGNOSIS — N62 Hypertrophy of breast: Secondary | ICD-10-CM

## 2016-05-11 DIAGNOSIS — E7800 Pure hypercholesterolemia, unspecified: Secondary | ICD-10-CM

## 2016-05-11 NOTE — Progress Notes (Signed)
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 In 2012 he developed pain and sensitivity in both breasts, and a lump in his right breast, which was evaluated by right ultrasonography 09/26/2010. This showed a benign cyst measuring 0.6 cm but also low level echoes directly behind the nipple consistent with gynecomastia. There were no suspicious solid masses. The axilla was unremarkable.  His finasteride was stopped and he was started on anastrozole for "a few months", with excellent clinical results. However he subsequently resumed that medication and again developed bilateral breast discomfort, right greater than left. We have bilateral digital mammography with right sonography from a different mammographic Center in [place] dated March 20 713. This found the same 0.6 cm right breast cyst as previously noted. There was bilateral fibroglandular tissue underlying the left nipples, right much greater than left. There were no suspicious microcalcifications and no axillary adenopathy.  The patient went off Propecia definitively approximately 18 months ago, he says, but more recently he has had again symptoms involving both breasts. This time the left breast is more of a problem. Both breasts are sensitive, there slightly painful, and he is concerned that they may be developing a mass. He brought this to Dr. Darien Ramus attention and he was referred here for further evaluation and treatment  Approximately a  year later however  INTERVAL HISTORY: Mark returns today for follow-up of his gynecomastia. He has been off and anastrozole now for 3-4 months.He tolerated the medication well while he was on itand he feels it brought his fatty. Areolar tissue down to a minimum level. It also further thinned his hair. When he went off it he did not notice any change in energy. He did have some joint pains particularly in his hips for some time but that has resolved.   REVIEW OF SYSTEMS: The right breast feels a little knotty to him. The left breast is more sensitive and the periareolar tissue is itchy. He has not noted any other changes in either breast. E exercises at the gym 3-4 times a week.A detailed review of systems today is otherwise entirely negative.   PAST MEDICAL HISTORY: Past Medical History:  Diagnosis Date  . Anxiety   . Fainting episodes   . GERD (gastroesophageal reflux disease)   . Hyperlipidemia   . Migraine     PAST SURGICAL HISTORY: No past surgical history on file.  FAMILY HISTORY Family History  Problem Relation Age of Onset  . Cancer Maternal Grandmother   . Heart disease Maternal Grandmother   . Hypertension Maternal Grandmother   The patient's parents are both alive and age 71 as of June 2017. They live in the [facility] area. The patient has one brother, no sisters. The patient's maternal grandmother was diagnosed with breast cancer in her 50s. A paternal grandfather was diagnosed with some kind of cancer in his late 25s. There is no history of ovarian cancer in the family.  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 His husband  [name] works in [employer] here. They have 2 dogs at home, a chocolate lab and a pit bull mix.    ADVANCED DIRECTIVES: Not in place. At the 08/12/2015 visit the patient was given the appropriate forms to complete and notarize at his discretion   HEALTH MAINTENANCE: Social History  Substance Use  Topics  . Smoking status: Never Smoker  . Smokeless tobacco: Never Used  . Alcohol use 6.0 oz/week    10 Glasses of wine per week     Colonoscopy:  PSA:  Lipid panel:  Allergies  Allergen Reactions  . Sulfa Antibiotics Rash    Current Outpatient Prescriptions  Medication Sig Dispense Refill  . anastrozole (ARIMIDEX) 1 MG tablet Take 1 tablet (1 mg total) by mouth daily. 90 tablet 1   No current facility-administered medications for this visit.     OBJECTIVE:Middle aged white man who appears well  Vitals:   05/11/16 1513  BP: 116/65  Pulse: 61  Resp: 18  Temp: 98.2 F (36.8 C)     Body mass index is 22.23 kg/m.    ECOG FS:0 - Asymptomatic  Sclerae unicteric, EOMs intact Oropharynx clear and moist No cervical or supraclavicular adenopathy Lungs no rales or rhonchi Heart regular rate and rhythm Abd soft, nontender, positive bowel sounds MSK no focal spinal tenderness, no joint edema Neuro: nonfocal, well oriented, appropriate affect Breasts: To my eye the patient's gynecomastia has resolved. I do not see the fatty "overhanging" previously noted in the right periareolar area and now both breasts are the same in size whereas before the right breast appeared larger. There are no palpable masses. I do not see any obvious irritation or erythema in the left areola whichoccasionally but not always itches. Both axillae are benign.    LAB RESULTS:  CMP     Component Value Date/Time   NA 142 08/12/2015 1606   K 4.0 08/12/2015 1606   CO2 31 (H) 08/12/2015 1606   GLUCOSE 94 08/12/2015 1606   BUN 20.4 08/12/2015 1606   CREATININE 1.2 08/12/2015 1606   CALCIUM 9.9 08/12/2015 1606   PROT 8.1 08/12/2015 1606   ALBUMIN 4.4 08/12/2015 1606   AST 15 08/12/2015 1606   ALT 21 08/12/2015 1606   ALKPHOS 64 08/12/2015 1606   BILITOT 0.57 08/12/2015 1606    INo results found for: SPEP, UPEP  Lab Results  Component Value Date   WBC 4.6 08/12/2015   NEUTROABS 2.5 08/12/2015     HGB 15.6 08/12/2015   HCT 46.4 08/12/2015   MCV 95.3 08/12/2015   PLT 186 08/12/2015      Chemistry      Component Value Date/Time   NA 142 08/12/2015 1606   K 4.0 08/12/2015 1606   CO2 31 (H) 08/12/2015 1606   BUN 20.4 08/12/2015 1606   CREATININE 1.2 08/12/2015 1606   GLU 84 08/09/2013      Component Value Date/Time   CALCIUM 9.9 08/12/2015 1606   ALKPHOS 64 08/12/2015 1606   AST 15 08/12/2015 1606   ALT 21 08/12/2015 1606   BILITOT 0.57 08/12/2015 1606      Ref Range & Units 25mo ago 26mo ago    Prostate Specific Ag, Serum 0.0 - 4.0 ng/mL 1.1  1.1CM      Estradiol, Sensitive 8.0 - 35.0 pg/mL 10.0     Testosterone, Serum 264 - 916 ng/dL 597  725CM  370R     No results found for: LABCA2  No components found for: [id number]  No results for input(s): INR in the last 168 hours.  Urinalysis No results found for: COLORURINE, APPEARANCEUR, LABSPEC, PHURINE, GLUCOSEU, HGBUR, BILIRUBINUR, KETONESUR, PROTEINUR, UROBILINOGEN, NITRITE, LEUKOCYTESUR    ELIGIBLE FOR AVAILABLE RESEARCH PROTOCOL: no  STUDIES: No results found.  ASSESSMENT: 46 y.o. [place] man with a history of gynecomastia associated with finasteride/Propecia, treated in the past with discontinuation of finasteride and a brief course (3 months) of anastrozole; now recurrent despite being off the finasteride for more than a year  (1) started anastrozole 08/12/2015, with normalization of hormones and improvement in the gynecomastia, discontinued November 2017  PLAN: Mark ad good cosmetic results as far as his gynecomastia is concerned and tolerated the treatment well. Specifically his last testosterone level was on the high end of normal and his estradiol in the low end of normal His PSA did not bunch.His primary care physician is carefully following his lipid panel and according to the patient has been no change  I don't think the itching he very intermittently has  On the left nipple is going to be related  to any of this. I suggested he try an antibiotic creamwhat the problem recurs and see if it responds  Otherwise I see no indication to return to anastrozole at this point. He himself is reluctant to continue on that medication because of hair loss issues  Accordingly I have made no further return appointments for Mark here. He knows of OB/GYN to see him at any point in the futureif on when the need arises.     Chauncey Cruel, MD   05/11/2016 3:26 PM Medical Oncology and Hematology [facility] [address] [place], [place] [address] Tel. [phone]    Fax. [phone]

## 2016-05-12 LAB — TESTOSTERONE: TESTOSTERONE: 401 ng/dL (ref 264–916)

## 2016-05-14 LAB — ESTRADIOL, ULTRA SENS: Estradiol, Sensitive: 17 pg/mL (ref 8.0–35.0)

## 2016-05-18 ENCOUNTER — Encounter: Payer: Self-pay | Admitting: Oncology

## 2016-05-27 DIAGNOSIS — M659 Synovitis and tenosynovitis, unspecified: Secondary | ICD-10-CM | POA: Diagnosis not present

## 2016-05-27 DIAGNOSIS — M25571 Pain in right ankle and joints of right foot: Secondary | ICD-10-CM | POA: Diagnosis not present

## 2016-05-27 DIAGNOSIS — D2371 Other benign neoplasm of skin of right lower limb, including hip: Secondary | ICD-10-CM | POA: Diagnosis not present

## 2016-06-19 DIAGNOSIS — L309 Dermatitis, unspecified: Secondary | ICD-10-CM | POA: Diagnosis not present

## 2016-07-03 DIAGNOSIS — L309 Dermatitis, unspecified: Secondary | ICD-10-CM | POA: Diagnosis not present

## 2016-07-03 DIAGNOSIS — L299 Pruritus, unspecified: Secondary | ICD-10-CM | POA: Diagnosis not present

## 2016-08-06 DIAGNOSIS — D225 Melanocytic nevi of trunk: Secondary | ICD-10-CM | POA: Diagnosis not present

## 2016-08-06 DIAGNOSIS — L309 Dermatitis, unspecified: Secondary | ICD-10-CM | POA: Diagnosis not present

## 2016-08-06 DIAGNOSIS — L814 Other melanin hyperpigmentation: Secondary | ICD-10-CM | POA: Diagnosis not present

## 2016-08-27 DIAGNOSIS — L309 Dermatitis, unspecified: Secondary | ICD-10-CM | POA: Diagnosis not present

## 2016-08-27 DIAGNOSIS — L218 Other seborrheic dermatitis: Secondary | ICD-10-CM | POA: Diagnosis not present

## 2016-08-28 DIAGNOSIS — L309 Dermatitis, unspecified: Secondary | ICD-10-CM | POA: Diagnosis not present

## 2016-09-23 DIAGNOSIS — L309 Dermatitis, unspecified: Secondary | ICD-10-CM | POA: Diagnosis not present

## 2016-09-23 DIAGNOSIS — L218 Other seborrheic dermatitis: Secondary | ICD-10-CM | POA: Diagnosis not present

## 2016-10-29 ENCOUNTER — Encounter: Payer: Self-pay | Admitting: Allergy

## 2016-10-29 ENCOUNTER — Ambulatory Visit (INDEPENDENT_AMBULATORY_CARE_PROVIDER_SITE_OTHER): Payer: Commercial Managed Care - PPO | Admitting: Allergy

## 2016-10-29 VITALS — BP 118/70 | HR 75 | Temp 97.8°F | Resp 18 | Ht 71.0 in | Wt 165.0 lb

## 2016-10-29 DIAGNOSIS — L508 Other urticaria: Secondary | ICD-10-CM

## 2016-10-29 MED ORDER — MONTELUKAST SODIUM 10 MG PO TABS: 10.0000 mg | ORAL_TABLET | Freq: Every day | ORAL | 5 refills | Status: AC

## 2016-10-29 NOTE — Progress Notes (Signed)
New Patient Note  RE: Mark Flores MRN: [id number] DOB: 1970-05-12 Date of Office Visit: 10/29/2016  Referring provider: Druscilla Brownie, MD Primary care provider: [name], MD  Chief Complaint: rash  History of present illness: Mark Flores is a 46 y.o. male presenting today for consultation for rash.    He states he has been having issues with rash/itch since this spring.  He reports symptoms started with itching all over his body.  Then he developed a visible red and raised rash that would come and go. Hives would go away in course of hours.  Rash does not leave any marks or bruising, no associated swelling, no fever and no joint aches/pains.  He states warm showers exacerbate rash and itch.   He reports he has not rash or itch like this ever before.  He states before he noticed visible rash he thought he had "jock itch" as his private area was itchy as well and because of this he went to his PCP and got tested for "everything" including STIs; he states everything came back negative.  He states he has woken up from sleep due to the severe itchiness.  He has been treated with steroid which he states did help.   He was already taking Zyrtec which he states he has taken for years.  He was then recommended to take Zyrtec and Claritin together at night which helped initially. When he didn't seem to be helping he started taking the claritin in the mornings however he still continued to have a lot of itch. Over the past 1-2 months he does feel like the rash and itch have started to improve.   He has been off all antihistamines for the past month without any flare of his symptoms.   He also has seen dermatology Allyson Sabal derm) who recommended he try a variety of different topical steroid creams all of which he reports did not help.  He did have a biopsy of the rash (see results below).   He denies any preceding illnesses, new foods, new medications, stings, change in  soap/lotions/detergents.  He states he did change in soaps and detergents after rash started but did not note any improvement in his symptoms.  He also has had a itchy, flaking rash on her eyebrows and eyelid diagnosed as seb derm which did respond to topical steroid.    He has history rhinitis that is worse in the mornings.  He would normally take zyrtec to help control but as above has been off for past month.   No history of asthma, food allergy or eczema.      Review of systems: Review of Systems  Constitutional: Negative for chills, fever and malaise/fatigue.  HENT: Negative for congestion, ear discharge, ear pain, nosebleeds, sinus pain and sore throat.   Eyes: Negative for discharge and redness.  Respiratory: Negative for cough, shortness of breath and wheezing.   Cardiovascular: Negative for chest pain.  Gastrointestinal: Negative for abdominal pain, constipation, diarrhea, heartburn, nausea and vomiting.  Musculoskeletal: Negative for joint pain.  Skin: Positive for itching and rash.  Neurological: Negative for headaches.    All other systems negative unless noted above in HPI  Past medical history: Past Medical History:  Diagnosis Date  . Anxiety   . Fainting episodes   . GERD (gastroesophageal reflux disease)   . Hyperlipidemia   . Migraine     Past surgical history: History reviewed. No pertinent surgical history.  Family history:  Family History  Problem Relation Age of Onset  . Cancer Maternal Grandmother   . Heart disease Maternal Grandmother   . Hypertension Maternal Grandmother     Social history: He lives in a home without carpeting with gas and electric heating, central cooling.  There is a dog in the home.  There is no concern for water damage, mildew or roaches in the home.  He is a Cabin crew.  He has no smoking history.     Medication List: Allergies as of 10/29/2016      Reactions   Sulfa Antibiotics Rash      Medication List       Accurate as  of 10/29/16  5:11 PM. Always use your most recent med list.          ALPRAZolam 1 MG tablet Commonly known as:  XANAX take 1 tablet by mouth twice a day if needed   cetirizine 10 MG tablet Commonly known as:  ZYRTEC Take 10 mg by mouth daily.   famotidine 20 MG tablet Commonly known as:  PEPCID Take 20 mg by mouth 2 (two) times daily.   Fish Oil 1000 MG Caps Take 4,000 mg by mouth daily.   fluocinonide cream 0.05 % Commonly known as:  LIDEX apply to affected area twice a day if needed   GREEN TEA EXTRACT PO Take 50 mg by mouth daily.   ketoconazole 2 % cream Commonly known as:  NIZORAL apply to AFFECTED AREA(S) OF FACE twice a day   L-Lysine 500 MG Tabs Take 500 mg by mouth daily.   loratadine 10 MG tablet Commonly known as:  CLARITIN Take 10 mg by mouth daily.   montelukast 10 MG tablet Commonly known as:  SINGULAIR Take 1 tablet (10 mg total) by mouth at bedtime.   NEXIUM 20 MG packet Generic drug:  esomeprazole Take 20 mg by mouth daily before breakfast.   PUMPKIN SEED OIL PO Take 1,000 mg by mouth daily.   triamcinolone 0.025 % cream Commonly known as:  KENALOG   Vitamin D3 2000 units Tabs Take 2,000 Units by mouth daily.     Known medication allergies: Allergies  Allergen Reactions  . Sulfa Antibiotics Rash    Physical examination: Blood pressure 118/70, pulse 75, temperature 97.8 F (36.6 C), resp. rate 18, height 5\' 11"  (1.803 m), weight 165 lb (74.8 kg), SpO2 96 %.  General: Alert, interactive, in no acute distress. HEENT: PERRLA, TMs pearly gray, turbinates non-edematous without discharge, post-pharynx non erythematous. Neck: Supple without lymphadenopathy. Lungs: Clear to auscultation without wheezing, rhonchi or rales. {no increased work of breathing. CV: Normal S1, S2 without murmurs. Abdomen: Nondistended, nontender. Skin: Scattered erythematous urticarial type lesions primarily located upper back/shoulder on right ,  nonvesicular. Extremities:  No clubbing, cyanosis or edema. Neuro:   Grossly intact.  Diagnositics/Labs: Skin punch biopsy from 08/28/2016 shows subacute spongiotic dermatitis. There is spongiotic says any. Vascular inflammatory infiltrate. Allergic contact, nummular and other spongiotic processes into the differential. A PaSF stain was performed with appropriate controls and is negative.  Reviewed labs from PCP from 06/2016 including CBC and CMP both wnl.   Allergy testing: deferred due to ongoing rash Allergy testing results were read and interpreted by provider, documented by clinical staff.   Assessment and plan:   Hives (urticaria) without swelling (angioedema)     - description and pictures of rash appear consistent with hives.  He does have component of physical urticaria with symptoms worsening with showering (warmth/heat), activity.       -  symptoms are improving and at this time can hold off on any treatment.  If symptoms return would recommend regimen of Zyrtec 10mg  twice day, Pepcid 20mg  twice a day +/- Singulair 10mg  daily.       - labs from PCP with normal CBC and CMP.   Will obtain additional labs including: chronic hive panel, alpha-gal panel, environmental allergy panel, tryptase     - let us know if symptoms return and you have swelling, fevers, or if hives leave marks/bruising.       - may perform patch testing for possible contact dermatitis however hives need to be resolved for at least 1 month prior to patch application       Follow-up 4-6 months or sooner if needed   I appreciate the opportunity to take part in Mark Flores's care. Please do not hesitate to contact me with questions.  Sincerely,   Prudy Feeler, MD Allergy/Immunology [facility]

## 2016-10-29 NOTE — Patient Instructions (Addendum)
Hives (urticaria) without swelling     - description and pictures of your rash appear consistent with hives.  You have a component of physical urticaria with symptoms worsening with showering (warmth/heat), activity.       - symptoms are improving and at this time can hold off on any treatment.  If symptoms return would recommend regimen of Zyrtec 10mg  twice day, Pepcid 20mg  twice a day +/- Singulair 10mg  daily.       - labs from PCP with normal CBC and CMP.   Will obtain additional labs including: chronic hive panel, alpha-gal panel, environmental allergy panel, tryptase     - let us know if symptoms return and you have swelling, fevers, or if hives leave marks/bruising.       - may perform patch testing for possible contact dermatitis however hives need to be resolved for at least 1 month prior to patch application       Follow-up 4-6 months or sooner if needed

## 2016-11-04 LAB — ALPHA-GAL PANEL
Beef (Bos spp) IgE: <0.1 kU/L (ref <0.35)
Class Interpretation: 0
Class Interpretation: 0
Lamb/Mutton (Ovis spp) IgE: <0.1 kU/L (ref <0.35)
PORK CLASS INTERPRETATION: 0
Pork (Sus spp) IgE: <0.1 kU/L (ref <0.35)

## 2016-11-04 LAB — IGE+ALLERGENS ZONE 2(30)
Alternaria Alternata IgE: 2.49 kU/L — AB
Amer Sycamore IgE Qn: 0.75 kU/L — AB
Aspergillus Fumigatus IgE: 0.48 kU/L — AB
Bermuda Grass IgE: 0.95 kU/L — AB
Cat Dander IgE: 0.11 kU/L — AB
Cedar, Mountain IgE: 4.33 kU/L — AB
Cladosporium Herbarum IgE: <0.1 kU/L
D Farinae IgE: 0.6 kU/L — AB
D Pteronyssinus IgE: 0.72 kU/L — AB
E005-IGE DOG DANDER: 4.39 kU/L — AB
G010-IGE JOHNSON GRASS: 0.72 kU/L — AB
G017-IGE BAHIA GRASS: 1.05 kU/L — AB
Hickory, White IgE: 0.71 kU/L — AB
IgE (Immunoglobulin E), Serum: 111 IU/mL — ABNORMAL HIGH (ref 0–100)
MUGWORT IGE QN: 0.65 kU/L — AB
Maple/Box Elder IgE: 0.51 kU/L — AB
Mucor Racemosus IgE: <0.1 kU/L
Nettle IgE: 0.28 kU/L — AB
Penicillium Chrysogen IgE: 0.36 kU/L — AB
Ragweed, Short IgE: 3.91 kU/L — AB
SHEEP SORREL IGE QN: 0.4 kU/L — AB
Stemphylium Herbarum IgE: 1 kU/L — AB
Sweet gum IgE RAST Ql: 0.42 kU/L — AB
T003-IGE COMMON SILVER BIRCH: 0.46 kU/L — AB
T007-IGE OAK, WHITE: 3.56 kU/L — AB
T008-IGE ELM, AMERICAN: 2.26 kU/L — AB
Timothy Grass IgE: 3.02 kU/L — AB
W009-IGE PLANTAIN, ENGLISH: 0.32 kU/L — AB
W014-IGE PIGWEED, ROUGH: 0.9 kU/L — AB
White Mulberry IgE: <0.1 kU/L

## 2016-11-04 LAB — TRYPTASE: TRYPTASE: 3 ug/L (ref 2.2–13.2)

## 2016-11-04 LAB — CHRONIC URTICARIA: CU INDEX: 2 (ref <10)

## 2016-11-10 ENCOUNTER — Telehealth: Payer: Self-pay | Admitting: Adult Health

## 2016-11-10 NOTE — Telephone Encounter (Signed)
Patient has seen Dr.Magrinat before and needed to see him again because the symptoms have come back. Dr. Jana Hakim did not have any availabilities in the upcoming weeks, so I scheduled him with [facility].

## 2016-11-13 ENCOUNTER — Encounter: Payer: Self-pay | Admitting: Adult Health

## 2016-11-13 ENCOUNTER — Ambulatory Visit (HOSPITAL_BASED_OUTPATIENT_CLINIC_OR_DEPARTMENT_OTHER): Payer: Commercial Managed Care - PPO | Admitting: Adult Health

## 2016-11-13 VITALS — BP 115/71 | HR 75 | Temp 98.2°F | Resp 20 | Ht 71.0 in | Wt 162.6 lb

## 2016-11-13 DIAGNOSIS — N6489 Other specified disorders of breast: Secondary | ICD-10-CM

## 2016-11-13 DIAGNOSIS — N62 Hypertrophy of breast: Secondary | ICD-10-CM | POA: Diagnosis not present

## 2016-11-13 DIAGNOSIS — N644 Mastodynia: Secondary | ICD-10-CM

## 2016-11-13 NOTE — Progress Notes (Signed)
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 In 2012 he developed pain and sensitivity in both breasts, and a lump in his right breast, which was evaluated by right ultrasonography 09/26/2010. This showed a benign cyst measuring 0.6 cm but also low level echoes directly behind the nipple consistent with gynecomastia. There were no suspicious solid masses. The axilla was unremarkable.  His finasteride was stopped and he was started on anastrozole for "a few months", with excellent clinical results. However he subsequently resumed that medication and again developed bilateral breast discomfort, right greater than left. We have bilateral digital mammography with right sonography from a different mammographic Center in [place] dated March 20 713. This found the same 0.6 cm right breast cyst as previously noted. There was bilateral fibroglandular tissue underlying the left nipples, right much greater than left. There were no suspicious microcalcifications and no axillary adenopathy.  The patient went off Propecia definitively approximately 18 months ago, he says, but more recently he has had again symptoms involving both breasts. This time the left breast is more of a problem. Both breasts are sensitive, there slightly painful, and he is concerned that they may be developing a mass. He brought this to Dr. Darien Ramus attention and he was referred here for further evaluation and treatment  Approximately a  year later however  INTERVAL HISTORY: Mark is here today for evaluation of his right breast gynecomastia.  He has had a lot of challenges with hair loss and has taken propecia in the past for this.  Due to the Propecia, he subsequently developed right breast gynecomastia.  He has taken anastrozole in the past, but just for a brief time period.  It did amplify hair loss in him.  He stopped propecia due to all of this, but then began to search for natural DHT blockers, and has been taking a combination of pumpkin seed oil, plant sterols, and saw palmetto, and the right breast gynecomastia returned.  It is worse than previously, feels, more lumpy and deeper than previous.  He is concerned that he could have male breast cancer.     REVIEW OF SYSTEMS: A detailed ROS was conducted and is non contributory other than what is noted above.    PAST MEDICAL HISTORY: Past Medical History:  Diagnosis Date  . Anxiety   . Fainting episodes   . GERD (gastroesophageal reflux disease)   . Hyperlipidemia   . Migraine     PAST SURGICAL HISTORY: No past surgical history on file.  FAMILY HISTORY Family History  Problem Relation Age of Onset  . Cancer Maternal Grandmother   . Heart disease Maternal Grandmother   . Hypertension Maternal Grandmother   The patient's parents are both alive and age 58 as of June 2017. They live in the [facility] area. The patient has one brother, no sisters. The patient's maternal grandmother was diagnosed with breast cancer in her 6s. A paternal grandfather was diagnosed with some kind of cancer in his late 6s. There is no history of ovarian cancer in the  family.  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 His husband [name] works in [employer] here. They have 2 dogs at home, a chocolate lab and a pit bull mix.   ADVANCED DIRECTIVES: Not in place. At the 08/12/2015 visit the patient was given the appropriate forms to  complete and notarize at his discretion   HEALTH MAINTENANCE: Social History  Substance Use Topics  . Smoking status: Never Smoker  . Smokeless tobacco: Never Used  . Alcohol use 6.0 oz/week    10 Glasses of wine per week     Colonoscopy:  PSA:  Lipid panel:  Allergies  Allergen Reactions  . Sulfa Antibiotics Rash    Current Outpatient Prescriptions  Medication Sig Dispense Refill  . ALPRAZolam (XANAX) 1 MG tablet take 1 tablet by mouth twice a day if needed  0  . cetirizine (ZYRTEC) 10 MG tablet Take 10 mg by mouth daily.    . Cholecalciferol (VITAMIN D3) 2000 units TABS Take 2,000 Units by mouth daily.    Marland Kitchen esomeprazole (NEXIUM) 20 MG packet Take 20 mg by mouth daily before breakfast.    . famotidine (PEPCID) 20 MG tablet Take 20 mg by mouth 2 (two) times daily.    . fluocinonide cream (LIDEX) 0.05 % apply to affected area twice a day if needed  0  . Green Tea, Camillia sinensis, (GREEN TEA EXTRACT PO) Take 50 mg by mouth daily.    Marland Kitchen ketoconazole (NIZORAL) 2 % cream apply to AFFECTED AREA(S) OF FACE twice a day  0  . L-Lysine 500 MG TABS Take 500 mg by mouth daily.    Marland Kitchen loratadine (CLARITIN) 10 MG tablet Take 10 mg by mouth daily.    . Misc Natural Products (PUMPKIN SEED OIL PO) Take 1,000 mg by mouth daily.    . montelukast (SINGULAIR) 10 MG tablet Take 1 tablet (10 mg total) by mouth at bedtime. 30 tablet 5  . Omega-3 Fatty Acids (FISH OIL) 1000 MG CAPS Take 4,000 mg by mouth daily.    Marland Kitchen triamcinolone (KENALOG) 0.025 % cream   0   No current facility-administered medications for this visit.     OBJECTIVE:  Vitals:   11/13/16 1333  BP: 115/71  Pulse: 75  Resp: 20  Temp: 98.2 F (36.8 C)  SpO2: 99%     Body mass index is 22.68 kg/m.    ECOG FS:0 - Asymptomatic  GENERAL: Patient is a well appearing male in no acute distress HEENT:  Sclerae anicteric.  Oropharynx clear and moist. No ulcerations or evidence of oropharyngeal candidiasis. Neck is supple.  NODES:   No cervical, supraclavicular, or axillary lymphadenopathy palpated.  BREAST EXAM:  Right breast greater than left.  No specific nodules, but the right breast is larger, more full, and tenderness is noted just underneath the right nipple, left breast is normal without nodules, masses, skin or nipple changes.   LUNGS:  Clear to auscultation bilaterally.  No wheezes or rhonchi. HEART:  Regular rate and rhythm. No murmur appreciated. ABDOMEN:  Soft, nontender.  Positive, normoactive bowel sounds. No organomegaly palpated. MSK:  No focal spinal tenderness to palpation. Full range of motion bilaterally in the upper extremities. EXTREMITIES:  No peripheral edema.   SKIN:  Clear with no obvious rashes or skin changes. No nail dyscrasia. NEURO:  Nonfocal. Well oriented.  Appropriate affect.      LAB RESULTS:  CMP     Component Value  Date/Time   NA 142 08/12/2015 1606   K 4.0 08/12/2015 1606   CO2 31 (H) 08/12/2015 1606   GLUCOSE 94 08/12/2015 1606   BUN 20.4 08/12/2015 1606   CREATININE 1.2 08/12/2015 1606   CALCIUM 9.9 08/12/2015 1606   PROT 8.1 08/12/2015 1606   ALBUMIN 4.4 08/12/2015 1606   AST 15 08/12/2015 1606   ALT 21 08/12/2015 1606   ALKPHOS 64 08/12/2015 1606   BILITOT 0.57 08/12/2015 1606    INo results found for: SPEP, UPEP  Lab Results  Component Value Date   WBC 4.6 08/12/2015   NEUTROABS 2.5 08/12/2015   HGB 15.6 08/12/2015   HCT 46.4 08/12/2015   MCV 95.3 08/12/2015   PLT 186 08/12/2015      Chemistry      Component Value Date/Time   NA 142 08/12/2015 1606   K 4.0 08/12/2015 1606   CO2 31 (H) 08/12/2015 1606   BUN 20.4 08/12/2015 1606   CREATININE 1.2 08/12/2015 1606   GLU 84 08/09/2013      Component Value Date/Time   CALCIUM 9.9 08/12/2015 1606   ALKPHOS 64 08/12/2015 1606   AST 15 08/12/2015 1606   ALT 21 08/12/2015 1606   BILITOT 0.57 08/12/2015 1606      Ref Range & Units 81mo ago 26mo ago    Prostate Specific Ag, Serum 0.0 - 4.0 ng/mL 1.1   1.1CM      Estradiol, Sensitive 8.0 - 35.0 pg/mL 10.0     Testosterone, Serum 264 - 916 ng/dL 597  725CM  370R     No results found for: LABCA2  No components found for: LABCA125  No results for input(s): INR in the last 168 hours.  Urinalysis No results found for: COLORURINE, APPEARANCEUR, LABSPEC, PHURINE, GLUCOSEU, HGBUR, BILIRUBINUR, KETONESUR, PROTEINUR, UROBILINOGEN, NITRITE, LEUKOCYTESUR    ELIGIBLE FOR AVAILABLE RESEARCH PROTOCOL: no  STUDIES: No results found.  ASSESSMENT: 46 y.o. [place] man with a history of gynecomastia associated with finasteride/Propecia, treated in the past with discontinuation of finasteride and a brief course (3 months) of anastrozole; now recurrent despite being off the finasteride for more than a year  (1) started anastrozole 08/12/2015, with normalization of hormones and improvement in the gynecomastia, discontinued November 2017  PLAN:  Mark and I had a long discussion about his breasts, and his options for treatment/managment.  It is likely not cancer, however, due to the asymmetry and his concern, we will go ahead and get a right breast diagnostic mammogram and ultrasound.  We discussed that he will stop the pumpkin seed oils, plant sterols, and saw palmetto.  We will see how this does for a few months.  If the right breast doesn't get better, or if it begins to worsen, I gave him my card.  He can call me and he can start back on the Anastrozole.    A total of (30) minutes of face-to-face time was spent with this patient with greater than 50% of that time in counseling and care-coordination.    Scot Dock, NP   11/13/2016 1:39 PM Medical Oncology and Hematology [facility] [address] [place], [place] [address] Tel. [phone]    Fax. [phone]

## 2016-11-18 ENCOUNTER — Other Ambulatory Visit: Payer: Commercial Managed Care - PPO

## 2016-12-16 ENCOUNTER — Ambulatory Visit: Payer: Self-pay | Admitting: Allergy

## 2017-02-23 ENCOUNTER — Telehealth: Payer: Self-pay | Admitting: *Deleted

## 2017-02-23 DIAGNOSIS — E559 Vitamin D deficiency, unspecified: Secondary | ICD-10-CM | POA: Diagnosis not present

## 2017-02-23 DIAGNOSIS — Z125 Encounter for screening for malignant neoplasm of prostate: Secondary | ICD-10-CM | POA: Diagnosis not present

## 2017-02-23 DIAGNOSIS — E785 Hyperlipidemia, unspecified: Secondary | ICD-10-CM | POA: Diagnosis not present

## 2017-02-23 DIAGNOSIS — N62 Hypertrophy of breast: Secondary | ICD-10-CM | POA: Diagnosis not present

## 2017-02-23 DIAGNOSIS — Z0000 Encounter for general adult medical examination without abnormal findings: Secondary | ICD-10-CM | POA: Diagnosis not present

## 2017-02-23 NOTE — Telephone Encounter (Addendum)
"  I need to schedule an appointment with Dr. Jana Hakim after January 1st.  I have Gynecomastia.  Had a physical, blood work, some swelling and I have some concerns, would like his opinion.  I cancelled mammogram September 12 th.  Did not call back to reschedule doing homeopathic things on my own.  Does he do any procedures, extract glands or anything like this.  Return number [phone]."  Provided phone number [phone] encouraging to call [facility] to reschedule mammogram if orders are still active.  Transferred call to the [facility].    Noted bilateral diagnostic mammogram/US is scheduled 03-11-2017 at 9:10 am.  Routing call information to collaborative nurse and provider for review.  Further patient communication through collaborative nurse.

## 2017-02-24 NOTE — Telephone Encounter (Signed)
Noted orders for mammo and U/S are still active.   Inbox request sent for appointment for January 2019.

## 2017-03-11 ENCOUNTER — Ambulatory Visit: Payer: Commercial Managed Care - PPO

## 2017-03-11 ENCOUNTER — Ambulatory Visit
Admission: RE | Admit: 2017-03-11 | Discharge: 2017-03-11 | Disposition: A | Discharge status: Home / Self Care | Payer: Commercial Managed Care - PPO | Source: Ambulatory Visit | Attending: Adult Health | Admitting: Adult Health

## 2017-03-11 DIAGNOSIS — N6489 Other specified disorders of breast: Secondary | ICD-10-CM

## 2017-03-11 DIAGNOSIS — N62 Hypertrophy of breast: Secondary | ICD-10-CM

## 2017-03-11 DIAGNOSIS — R922 Inconclusive mammogram: Secondary | ICD-10-CM | POA: Diagnosis not present

## 2017-03-11 DIAGNOSIS — N644 Mastodynia: Secondary | ICD-10-CM

## 2017-08-10 DIAGNOSIS — E785 Hyperlipidemia, unspecified: Secondary | ICD-10-CM | POA: Diagnosis not present

## 2017-08-10 DIAGNOSIS — L309 Dermatitis, unspecified: Secondary | ICD-10-CM | POA: Diagnosis not present

## 2017-09-15 ENCOUNTER — Encounter: Payer: Self-pay | Admitting: Allergy

## 2018-03-14 DIAGNOSIS — Z125 Encounter for screening for malignant neoplasm of prostate: Secondary | ICD-10-CM | POA: Diagnosis not present

## 2018-03-14 DIAGNOSIS — Z23 Encounter for immunization: Secondary | ICD-10-CM | POA: Diagnosis not present

## 2018-03-14 DIAGNOSIS — E785 Hyperlipidemia, unspecified: Secondary | ICD-10-CM | POA: Diagnosis not present

## 2018-03-14 DIAGNOSIS — Z0000 Encounter for general adult medical examination without abnormal findings: Secondary | ICD-10-CM | POA: Diagnosis not present

## 2020-11-15 ENCOUNTER — Other Ambulatory Visit: Payer: Self-pay

## 2020-11-15 ENCOUNTER — Ambulatory Visit (INDEPENDENT_AMBULATORY_CARE_PROVIDER_SITE_OTHER): Payer: Commercial Managed Care - PPO

## 2020-11-15 DIAGNOSIS — Z23 Encounter for immunization: Secondary | ICD-10-CM

## 2020-11-15 NOTE — Progress Notes (Signed)
    [facility]  Name:  Mark Flores    MRN: [id number] DOB: [birth year]   11/15/2020  Mark Flores was observed post JYNNEOS immunization for 15 minutes without incident. He was provided with Vaccine Information Sheet and instruction to access the V-Safe system.   Mark Flores was instructed to call 911 with any severe reactions post vaccine: Difficulty breathing  Swelling of face and throat  A fast heartbeat  A bad rash all over body  Dizziness and weakness

## 2020-11-25 ENCOUNTER — Encounter: Payer: Self-pay | Admitting: Adult Health

## 2020-12-27 ENCOUNTER — Ambulatory Visit: Payer: Commercial Managed Care - PPO

## 2021-01-20 ENCOUNTER — Ambulatory Visit (INDEPENDENT_AMBULATORY_CARE_PROVIDER_SITE_OTHER): Payer: Commercial Managed Care - PPO

## 2021-01-20 ENCOUNTER — Other Ambulatory Visit: Payer: Self-pay

## 2021-01-20 DIAGNOSIS — Z23 Encounter for immunization: Secondary | ICD-10-CM | POA: Diagnosis not present

## 2021-01-20 NOTE — Progress Notes (Signed)
    [facility]  Name:  [name]    MRN: [id number] DOB: 1971-02-06   01/20/2021  Mr. Mark Flores was observed post JYNNEOS immunization for 15 minutes without incident. He was provided with Vaccine Information Sheet and instruction to access the V-Safe system.   Mr. Mark Flores was instructed to call 911 with any severe reactions post vaccine: Difficulty breathing  Swelling of face and throat  A fast heartbeat  A bad rash all over body  Dizziness and weakness    Leatrice Jewels, RMA

## 2021-05-26 ENCOUNTER — Other Ambulatory Visit (HOSPITAL_COMMUNITY): Payer: Self-pay | Admitting: Family Medicine

## 2021-05-26 DIAGNOSIS — E785 Hyperlipidemia, unspecified: Secondary | ICD-10-CM

## 2021-06-04 ENCOUNTER — Ambulatory Visit (HOSPITAL_COMMUNITY)
Admission: RE | Admit: 2021-06-04 | Discharge: 2021-06-04 | Disposition: A | Discharge status: Home / Self Care | Payer: Self-pay | Source: Ambulatory Visit | Attending: Family Medicine | Admitting: Family Medicine

## 2021-06-04 DIAGNOSIS — E785 Hyperlipidemia, unspecified: Secondary | ICD-10-CM | POA: Insufficient documentation

## 2024-03-10 IMAGING — CT CT CARDIAC CORONARY ARTERY CALCIUM SCORE
3 series · 14 of 20 positions shown, 15 images · non-contrast
Comparison: None.

Addendum:
CLINICAL DATA: Cardiovascular Disease Risk stratification

EXAM:
Coronary Calcium Score
TECHNIQUE: A gated, non-contrast computed tomography scan of the heart was
performed using 3mm slice thickness. Axial images were analyzed on a
dedicated workstation. Calcium scoring of the coronary arteries was
performed using the Agatston method.

[Series 3: ax ca scr 70% (id) · axial · 0.39mm/px · z∈[-336,-224]mm · 6 of 80 slices shown]
[im 12/80  vessel]
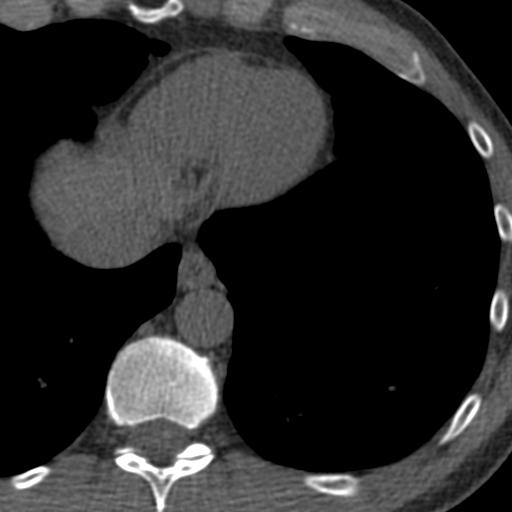
[im 23/80  vessel]
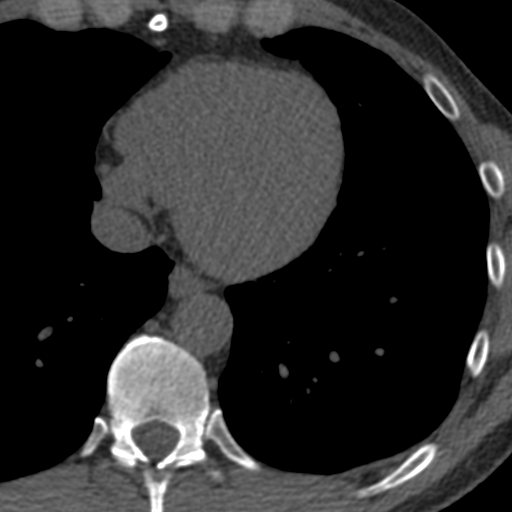
[im 34/80  vessel]
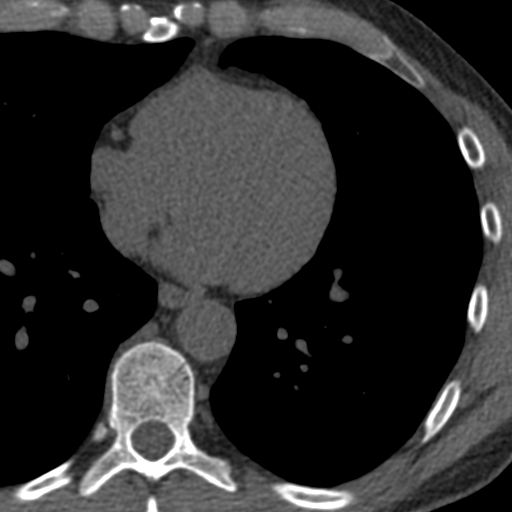
[im 46/80  vessel]
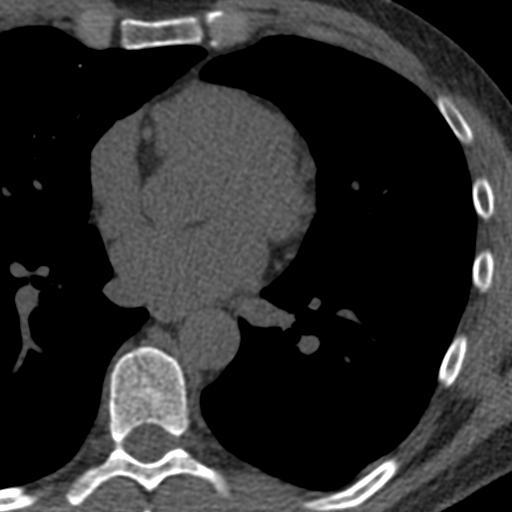
[im 57/80  vessel]
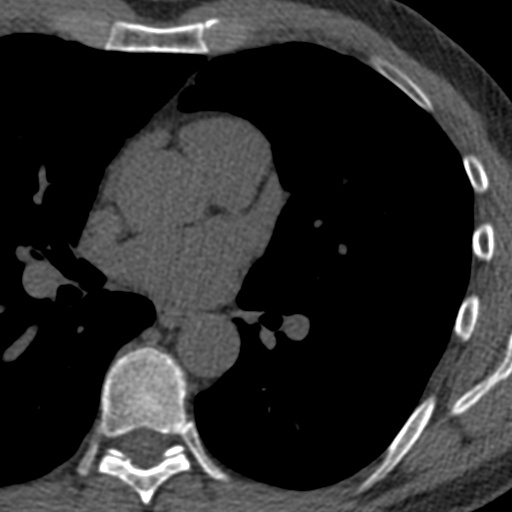
[im 68/80  vessel]
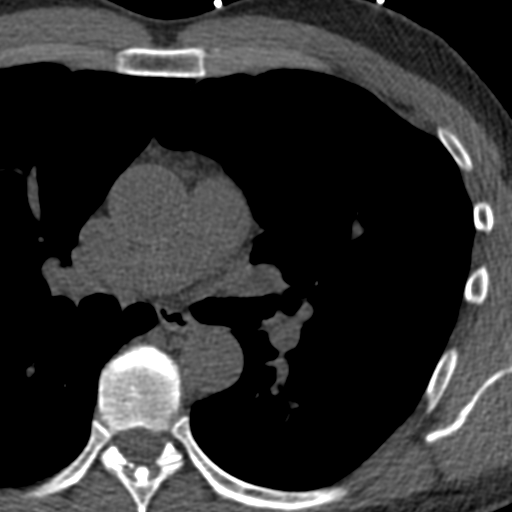

[Series 4: ax st · axial · 0.95mm/px · z∈[-326,-234]mm · 4 of 53 slices shown, 5 images]
[im 11/53  vessel]
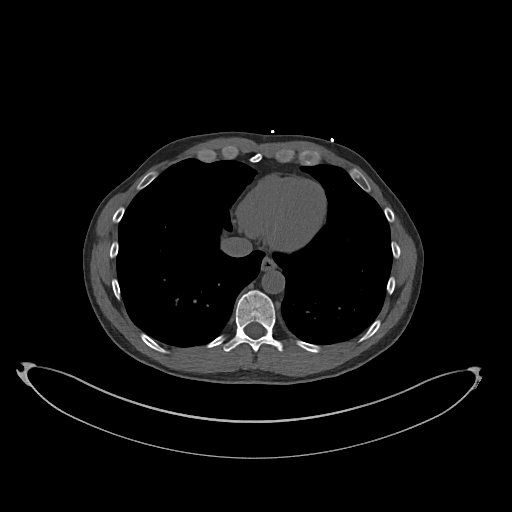
[im 11/53  lung]
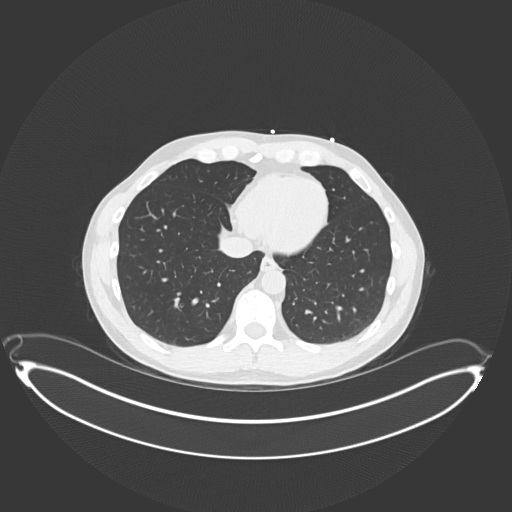
[im 21/53  vessel]
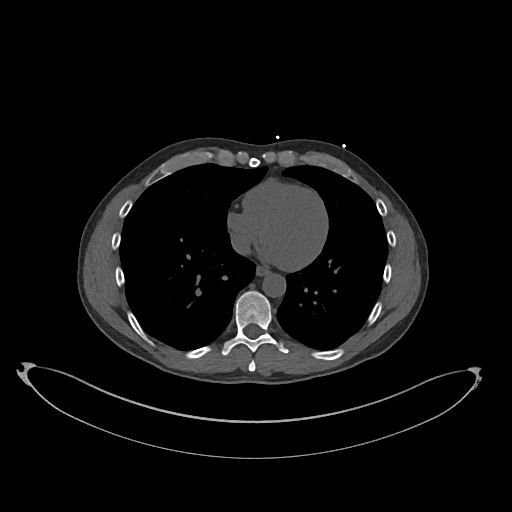
[im 32/53  vessel]
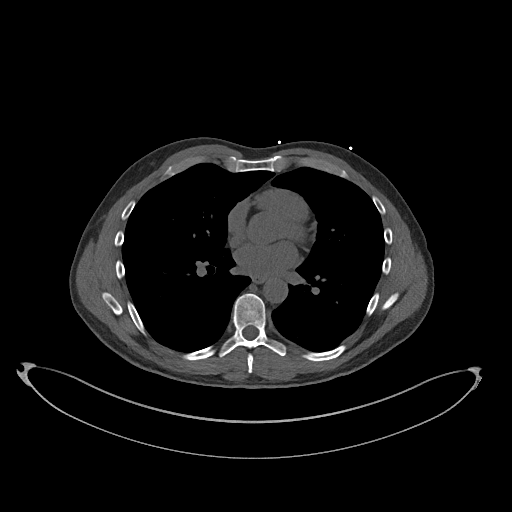
[im 42/53  vessel]
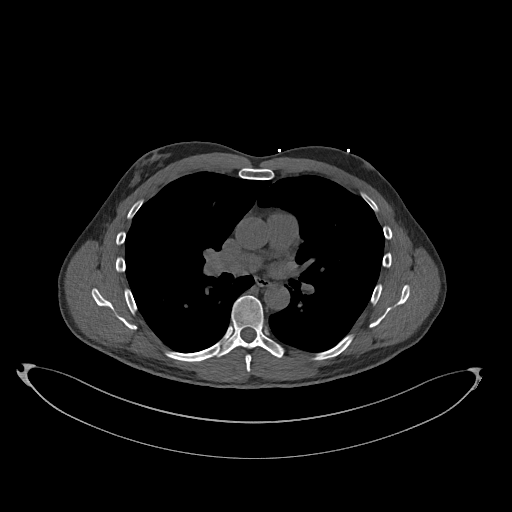

[Series 5: ax lung · axial · 0.95mm/px · z∈[-326,-234]mm · 4 of 53 slices shown]
[im 11/53  lung]
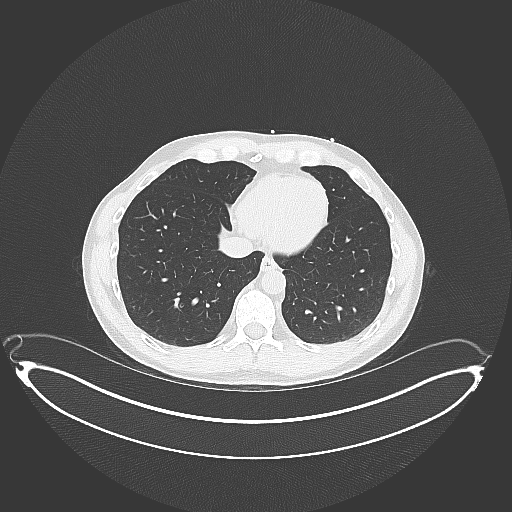
[im 21/53  lung]
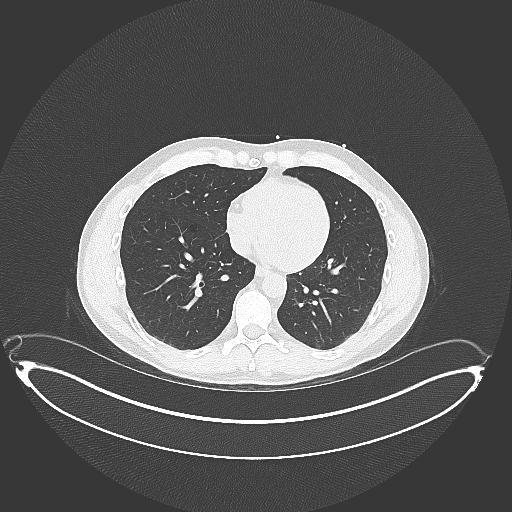
[im 32/53  lung]
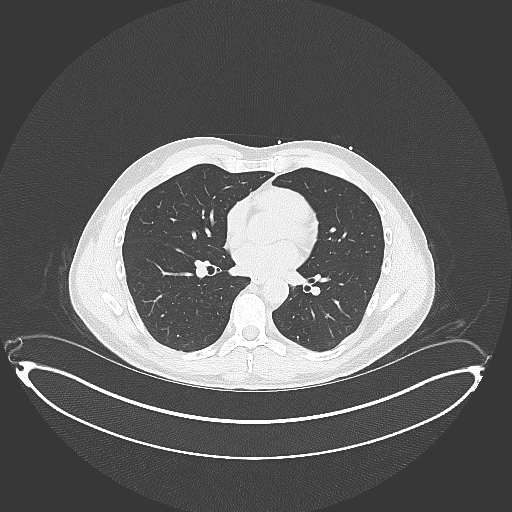
[im 42/53  lung]
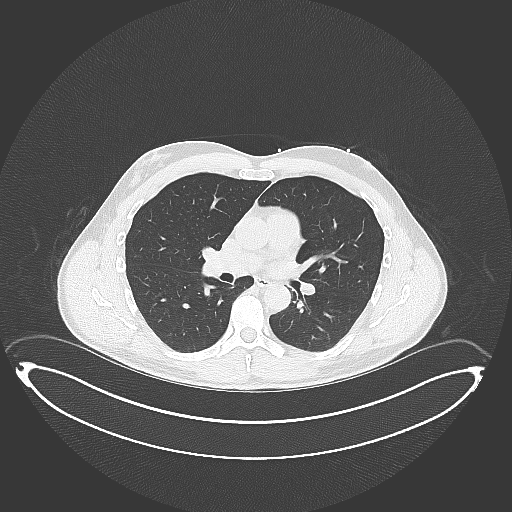

[14 of 20 positions shown; findings below may reference images not displayed]

FINDINGS: Coronary Calcium Score:

Left main: 0

Left anterior descending artery: 0

Left circumflex artery: 0

Right coronary artery: 0

Total: 0

Percentile: 0

Pericardium: Normal.

Ascending Aorta: Normal caliber.

Non-cardiac: See separate report from [REDACTED].
IMPRESSION: Coronary calcium score of 0. This was 0 percentile for age-, race-,
and sex-matched controls.



If CAC=0, it is reasonable to withhold statin therapy and reassess
in 5 to 10 years, as long as higher risk conditions are absent
(diabetes mellitus, family history of premature CHD in first degree
relatives (males <55 years; females <65 years), cigarette smoking,
or LDL >=190 mg/dL).

If CAC is 1 to 99, it is reasonable to initiate statin therapy for
patients >=55 years of age.

If CAC is >=100 or >=75th percentile, it is reasonable to initiate
statin therapy at any age.

Cardiology referral should be considered for patients with CAC
scores >=400 or >=75th percentile.

*3070 AHA/ACC/AACVPR/AAPA/ABC/YESELI/GRAMAJO/NIN/Sorin/ORLIN ARIEL/TIGER/OSEI
Guideline on the Management of Blood Cholesterol: A Report of the
American College of Cardiology/American Heart Association Task Force
on Clinical Practice Guidelines. J Am Coll Cardiol.
2477;73(24):8828-8654.

EXAM:
OVER-READ INTERPRETATION  CT CHEST

The following report is an over-read performed by radiologist Dr.
over-read does not include interpretation of cardiac or coronary
anatomy or pathology. The coronary artery calcium score
interpretation by the cardiologist is attached.
FINDINGS: Cardiovascular: No significant vascular findings on noncontrast
imaging. The heart size is normal. There is no pericardial effusion.

Mediastinum/Nodes: No visualized enlarged thoracic lymph nodes. The
esophagus appears unremarkable.

Lungs/Pleura: No pleural effusion or pneumothorax. The visualized
lungs are clear.

Upper abdomen: Small low-density lesions in the liver are likely
cysts and do not require any specific follow-up.

Musculoskeletal/Chest wall: There is no chest wall mass or
suspicious osseous finding. Asymmetric gynecomastia on the right.
IMPRESSION: 1. Asymmetric gynecomastia on the right.
2. No other significant extracardiac findings within the visualized
chest.

*** End of Addendum ***
FINDINGS: Coronary Calcium Score:

Left main: 0

Left anterior descending artery: 0

Left circumflex artery: 0

Right coronary artery: 0

Total: 0

Percentile: 0

Pericardium: Normal.

Ascending Aorta: Normal caliber.

Non-cardiac: See separate report from [REDACTED].
IMPRESSION: Coronary calcium score of 0. This was 0 percentile for age-, race-,
and sex-matched controls.



If CAC=0, it is reasonable to withhold statin therapy and reassess
in 5 to 10 years, as long as higher risk conditions are absent
(diabetes mellitus, family history of premature CHD in first degree
relatives (males <55 years; females <65 years), cigarette smoking,
or LDL >=190 mg/dL).

If CAC is 1 to 99, it is reasonable to initiate statin therapy for
patients >=55 years of age.

If CAC is >=100 or >=75th percentile, it is reasonable to initiate
statin therapy at any age.

Cardiology referral should be considered for patients with CAC
scores >=400 or >=75th percentile.

*3070 AHA/ACC/AACVPR/AAPA/ABC/YESELI/GRAMAJO/NIN/Sorin/ORLIN ARIEL/TIGER/OSEI
Guideline on the Management of Blood Cholesterol: A Report of the
American College of Cardiology/American Heart Association Task Force
on Clinical Practice Guidelines. J Am Coll Cardiol.
2477;73(24):8828-8654.
